# Patient Record
Sex: Female | Born: 1990 | Race: Black or African American | Hispanic: No | Marital: Single | Smoking: Former smoker
Health system: Southern US, Community
[De-identification: ages and names within clinical notes are randomized; demographics above are authoritative.]

## PROBLEM LIST (undated history)

## (undated) HISTORY — PX: CSF SHUNT: SHX92

---
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
## 2010-09-14 ENCOUNTER — Emergency Department: Payer: Self-pay | Admitting: Emergency Medicine

## 2011-09-16 IMAGING — US ABDOMEN ULTRASOUND LIMITED
1 series · 17 of 25 positions shown · non-contrast
Comparison: none

REASON FOR EXAM: ruq/epigastric pain, nausea
COMMENTS:

[Series 1: abdomen ultrasound limited · 17 of 41 slices shown]
[im 1/41]
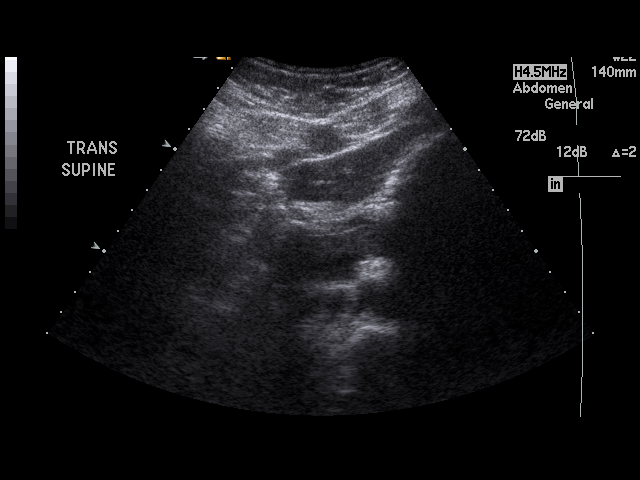
[im 4/41]
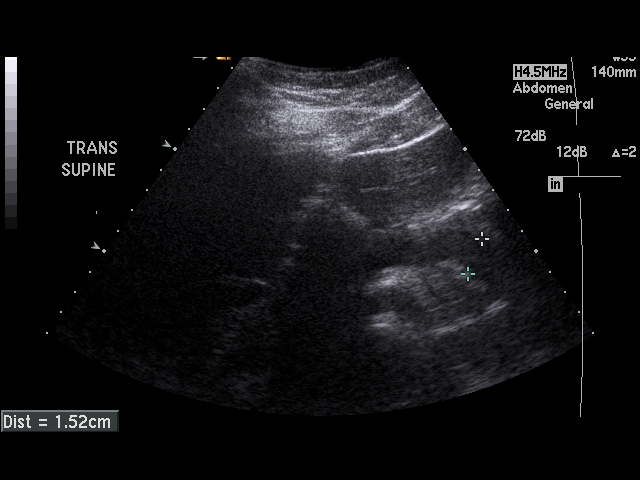
[im 6/41]
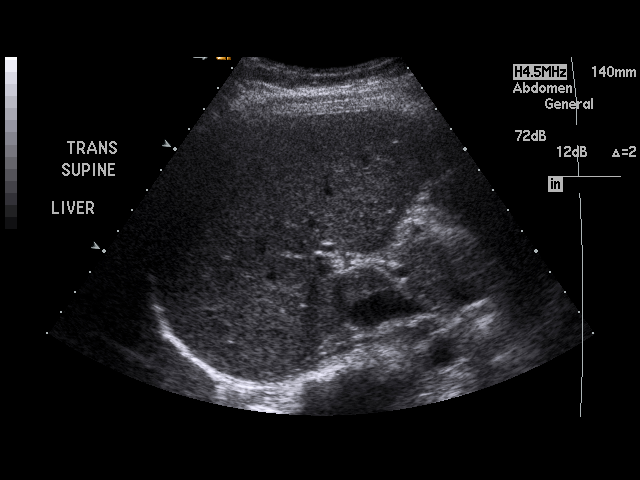
[im 9/41]
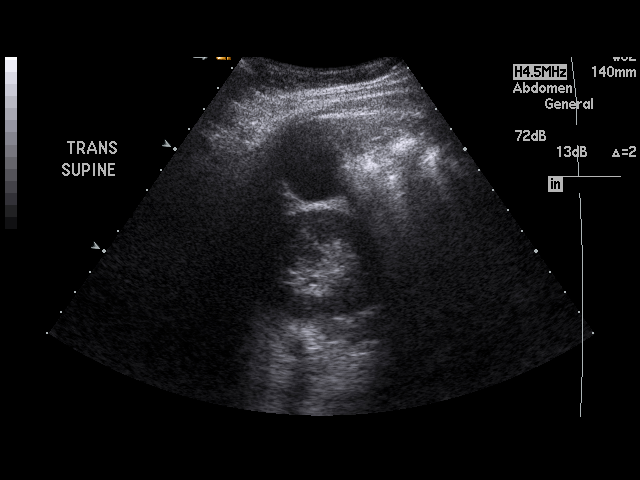
[im 11/41]
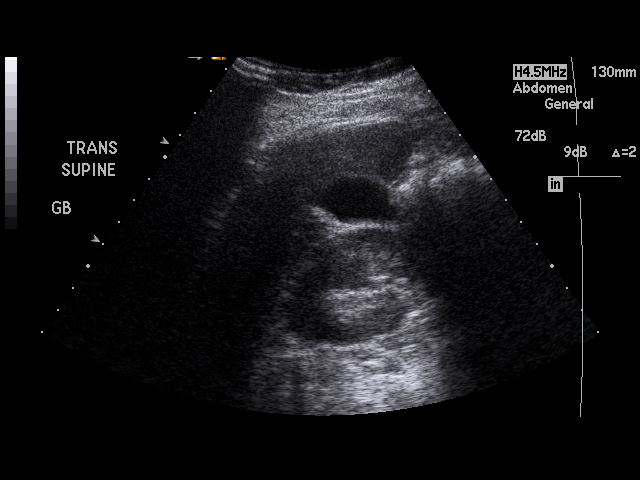
[im 14/41]
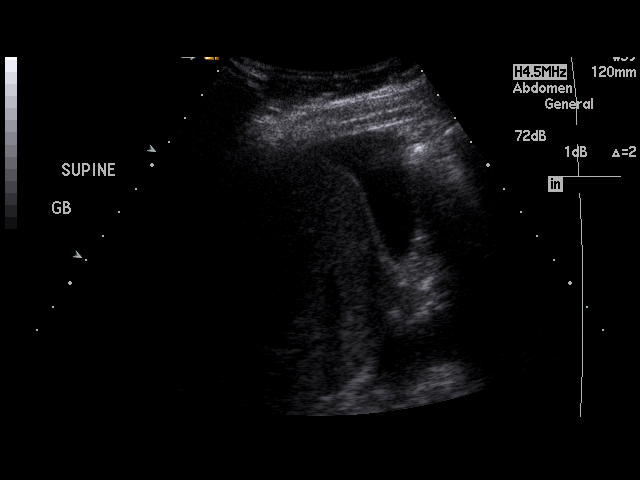
[im 16/41]
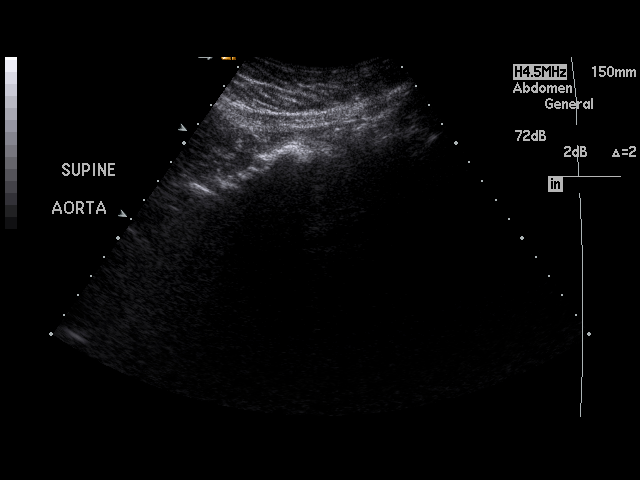
[im 19/41]
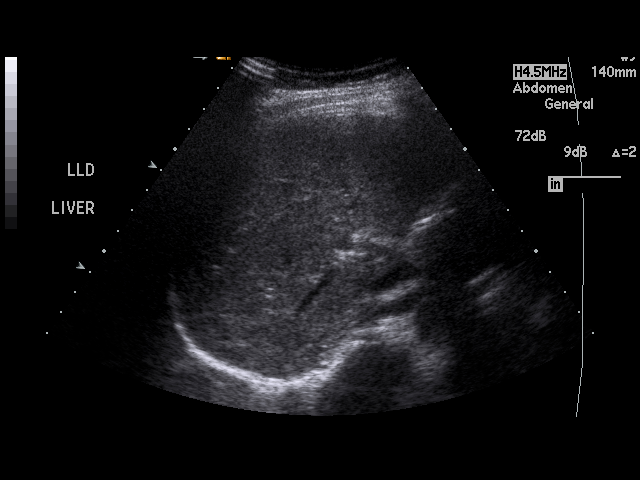
[im 21/41]
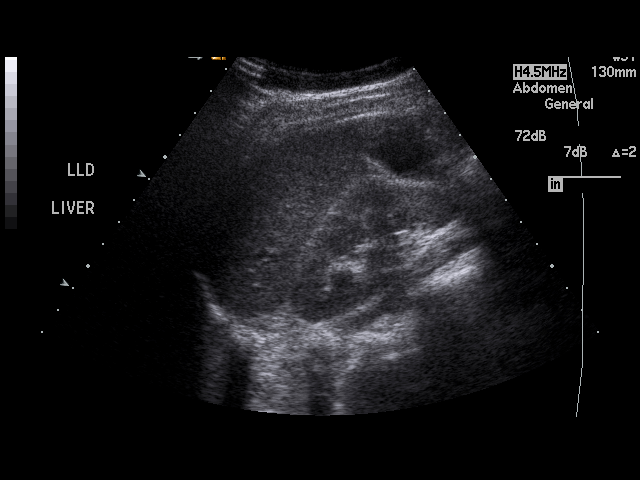
[im 22/41]
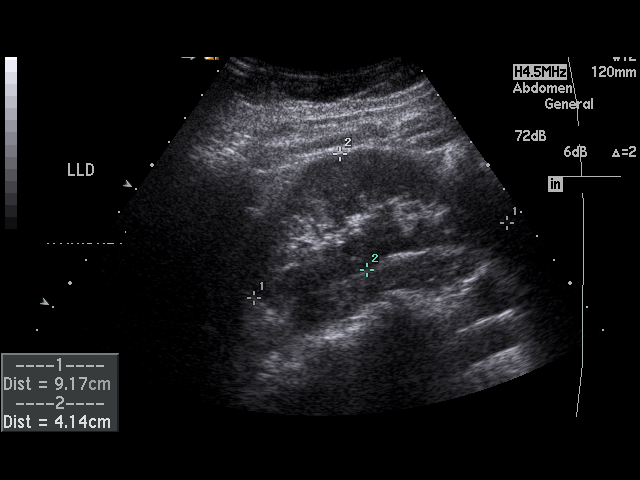
[im 26/41]
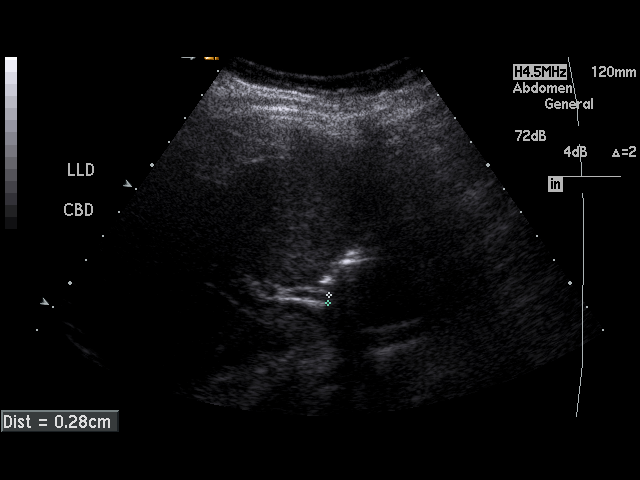
[im 27/41]
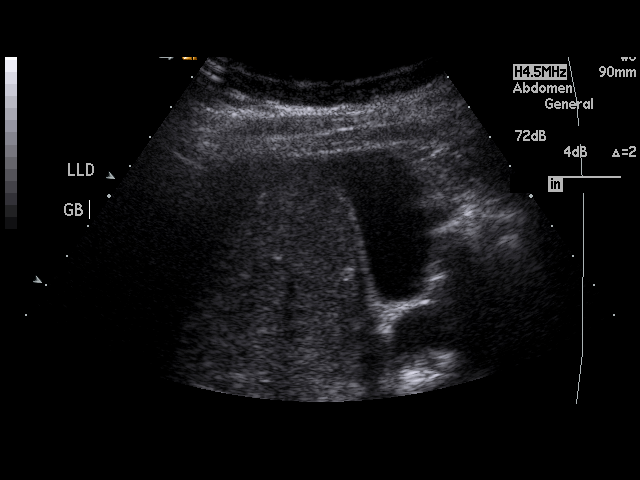
[im 31/41]
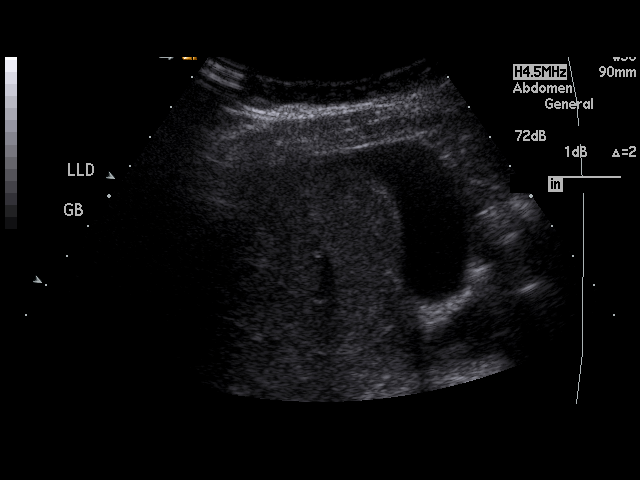
[im 32/41]
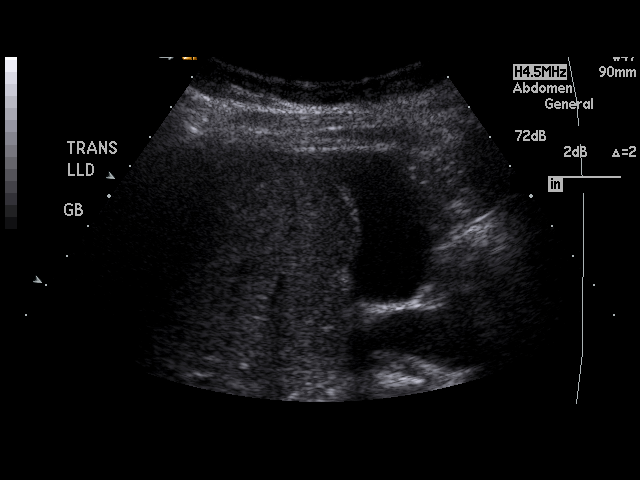
[im 36/41]
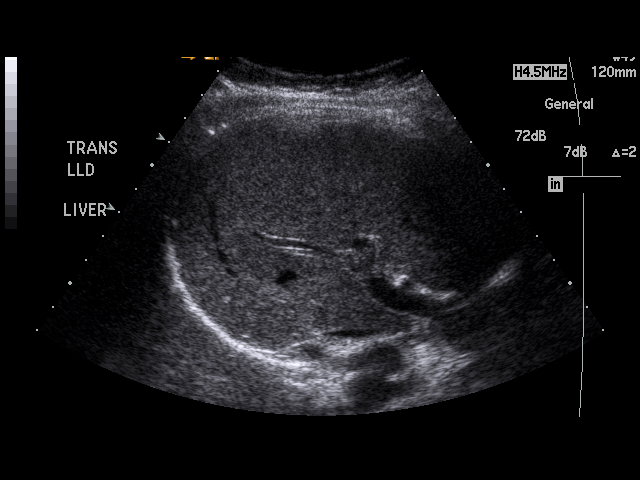
[im 37/41]
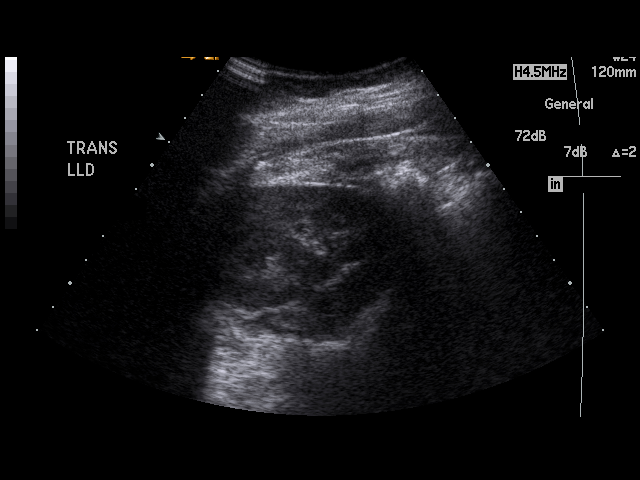
[im 41/41]
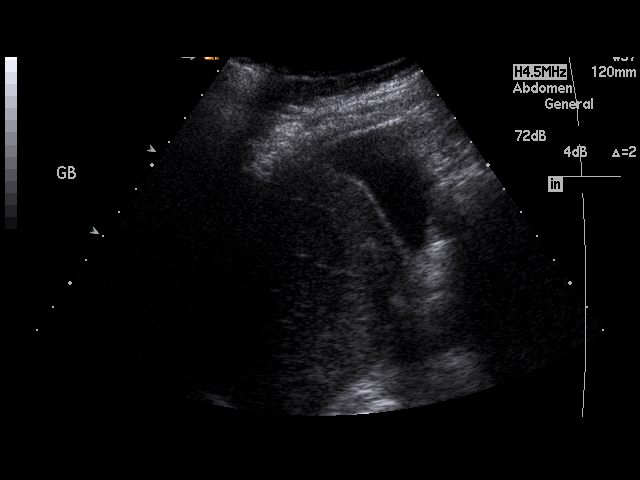

[17 of 25 positions shown; findings below may reference images not displayed]

PROCEDURE:     US  - US ABDOMEN LIMITED SURVEY  - September 15, 2010  [DATE]

RESULT:     The liver, gallbladder, pancreas, and right kidney are normal in
appearance. The common bile duct is normal at just under 3 mm in diameter.
There is no positive sonographic Murphy's sign. There is no evidence of
ascites.
IMPRESSION: Normal right upper quadrant ultrasound.

A preliminary report was sent to the [HOSPITAL] the conclusion
of the study.

## 2011-09-21 ENCOUNTER — Emergency Department: Payer: Self-pay | Admitting: Emergency Medicine

## 2011-12-31 ENCOUNTER — Emergency Department: Payer: Self-pay | Admitting: Emergency Medicine

## 2011-12-31 LAB — URINALYSIS, COMPLETE
Bilirubin,UR: NEGATIVE
Glucose,UR: NEGATIVE mg/dL (ref 0–75)
Ketone: NEGATIVE
Leukocyte Esterase: NEGATIVE
Nitrite: NEGATIVE
Ph: 6 (ref 4.5–8.0)
Protein: NEGATIVE
RBC,UR: 36 /HPF (ref 0–5)
Specific Gravity: 1.029 (ref 1.003–1.030)
Squamous Epithelial: <1
WBC UR: 2 /HPF (ref 0–5)

## 2011-12-31 LAB — WET PREP, GENITAL

## 2011-12-31 LAB — PREGNANCY, URINE: Pregnancy Test, Urine: NEGATIVE m[IU]/mL

## 2015-04-16 ENCOUNTER — Emergency Department (HOSPITAL_COMMUNITY)
Admission: EM | Admit: 2015-04-16 | Discharge: 2015-04-16 | Disposition: A | Discharge status: Home / Self Care | Payer: No Typology Code available for payment source | Attending: Emergency Medicine | Admitting: Emergency Medicine

## 2015-04-16 ENCOUNTER — Encounter (HOSPITAL_COMMUNITY): Payer: Self-pay | Admitting: *Deleted

## 2015-04-16 ENCOUNTER — Emergency Department (HOSPITAL_COMMUNITY): Payer: No Typology Code available for payment source

## 2015-04-16 DIAGNOSIS — R0789 Other chest pain: Secondary | ICD-10-CM

## 2015-04-16 DIAGNOSIS — Z3A24 24 weeks gestation of pregnancy: Secondary | ICD-10-CM | POA: Insufficient documentation

## 2015-04-16 DIAGNOSIS — O9989 Other specified diseases and conditions complicating pregnancy, childbirth and the puerperium: Secondary | ICD-10-CM | POA: Insufficient documentation

## 2015-04-16 LAB — BASIC METABOLIC PANEL
Anion gap: 7 (ref 5–15)
BUN: 13 mg/dL (ref 6–20)
CO2: 22 mmol/L (ref 22–32)
Calcium: 8.5 mg/dL — ABNORMAL LOW (ref 8.9–10.3)
Chloride: 105 mmol/L (ref 101–111)
Creatinine, Ser: 0.64 mg/dL (ref 0.44–1.00)
GFR calc non Af Amer: >60 mL/min (ref >60)
Glucose, Bld: 113 mg/dL — ABNORMAL HIGH (ref 70–99)
Potassium: 3.4 mmol/L — ABNORMAL LOW (ref 3.5–5.1)
SODIUM: 134 mmol/L — AB (ref 135–145)

## 2015-04-16 LAB — CBC
HCT: 31 % — ABNORMAL LOW (ref 36.0–46.0)
Hemoglobin: 10.9 g/dL — ABNORMAL LOW (ref 12.0–15.0)
MCH: 29.3 pg (ref 26.0–34.0)
MCHC: 35.2 g/dL (ref 30.0–36.0)
MCV: 83.3 fL (ref 78.0–100.0)
PLATELETS: 332 10*3/uL (ref 150–400)
RBC: 3.72 MIL/uL — ABNORMAL LOW (ref 3.87–5.11)
RDW: 14.3 % (ref 11.5–15.5)
WBC: 8 10*3/uL (ref 4.0–10.5)

## 2015-04-16 LAB — I-STAT TROPONIN, ED: TROPONIN I, POC: 0 ng/mL (ref 0.00–0.08)

## 2015-04-16 MED ORDER — FAMOTIDINE 20 MG PO TABS
20.0000 mg | ORAL_TABLET | Freq: Once | ORAL | Status: AC
  Administered 2015-04-16: 20 mg via ORAL
  Filled 2015-04-16: qty 1

## 2015-04-16 MED ORDER — FAMOTIDINE 20 MG PO TABS: 20.0000 mg | ORAL_TABLET | Freq: Once | ORAL | Status: DC

## 2015-04-16 NOTE — ED Provider Notes (Signed)
CSN: [id number]     Arrival date & time 04/16/15  0818 History   First MD Initiated Contact with Patient 04/16/15 [phone]     Chief Complaint  Patient presents with  . Chest Pain     (Consider location/radiation/quality/duration/timing/severity/associated sxs/prior Treatment) HPI Kathleen Burnett is a 24 y.o. female G1 P0 with VP shunt, [redacted] weeks pregnant comes in for evaluation of chest pain. Patient states this morning at 7 AM she woke up, getting ready for work when she experienced sharp, central chest pains that radiated to her back. The pain is exacerbated with respiration, nothing makes it better. Rates her discomfort as a 6/10. Denies fevers, chills, nausea or vomiting, shortness of breath, leg swelling, hemoptysis, history of blood clot. No other aggravating or modifying factors.  History reviewed. No pertinent past medical history. Past Surgical History  Procedure Laterality Date  . Csf shunt     History reviewed. No pertinent family history. History  Substance Use Topics  . Smoking status: Not on file  . Smokeless tobacco: Not on file  . Alcohol Use: No   OB History    Gravida Para Term Preterm AB TAB SAB Ectopic Multiple Living   1              Review of Systems A 10 point review of systems was completed and was negative except for pertinent positives and negatives as mentioned in the history of present illness     Allergies  Review of patient's allergies indicates no known allergies.  Home Medications   Prior to Admission medications   Medication Sig Start Date End Date Taking? Authorizing Provider  prenatal vitamin w/FE, FA (PRENATAL 1 + 1) 27-1 MG TABS tablet Take 1 tablet by mouth daily at 12 noon.   Yes Historical Provider, MD  famotidine (PEPCID) 20 MG tablet Take 1 tablet (20 mg total) by mouth once. 04/16/15   Fredia Chittenden, PA-C   BP 110/54 mmHg  Pulse 81  Temp(Src) 98.3 F (36.8 C) (Oral)  Resp 18  Ht 5\' 1"  (1.549 m)  Wt 170 lb (77.111 kg)  BMI  32.14 kg/m2  SpO2 98% Physical Exam  Constitutional: She is oriented to person, place, and time. She appears well-developed and well-nourished.  HENT:  Head: Normocephalic and atraumatic.  Mouth/Throat: Oropharynx is clear and moist.  Eyes: Conjunctivae are normal. Pupils are equal, round, and reactive to light. Right eye exhibits no discharge. Left eye exhibits no discharge. No scleral icterus.  Neck: Neck supple.  Cardiovascular: Normal rate, regular rhythm and normal heart sounds.   Pulmonary/Chest: Effort normal and breath sounds normal. No respiratory distress. She has no wheezes. She has no rales.  Sharp pains reproduced with palpation of lower one third sternum. No obvious lesions, rashes, crepitus or bony step-offs.  Abdominal: Soft. There is no tenderness.  Abdominal distention, consistent with pregnancy. No tenderness  Musculoskeletal: She exhibits no edema or tenderness.  No lower extremity swelling, erythema, warmth or tenderness along the venous system bilaterally.  Neurological: She is alert and oriented to person, place, and time.  Cranial Nerves II-XII grossly intact  Skin: Skin is warm and dry. No rash noted.  Psychiatric: She has a normal mood and affect.  Nursing note and vitals reviewed.   ED Course  Procedures (including critical care time) Labs Review Labs Reviewed  BASIC METABOLIC PANEL - Abnormal; Notable for the following:    Sodium 134 (*)    Potassium 3.4 (*)    Glucose, Bld 113 (*)  Calcium 8.5 (*)    All other components within normal limits  CBC - Abnormal; Notable for the following:    RBC 3.72 (*)    Hemoglobin 10.9 (*)    HCT 31.0 (*)    All other components within normal limits  Rosezena SensorI-STAT TROPOININ, ED    Imaging Review Dg Chest 2 View  04/16/2015   CLINICAL DATA:  24 year old female with a history of substernal chest pain.  EXAM: CHEST - 2 VIEW  COMPARISON:  None.  FINDINGS: Cardiomediastinal silhouette projects within normal limits in size  and contour. No confluent airspace disease, pneumothorax, or pleural effusion.  No displaced fracture.  Unremarkable appearance of the upper abdomen.  Shunt tubing projects over the right lower neck and right chest.  IMPRESSION: No radiographic evidence of acute cardiopulmonary disease.  Signed,  Yvone NeuJaime S. Loreta AveWagner, DO  [facility]  [facility]   Electronically Signed   By: Gilmer MorJaime  Wagner D.O.   On: 04/16/2015 11:27     EKG Interpretation   Date/Time:  Saturday Apr 16 2015 08:25:12 EDT Ventricular Rate:  91 PR Interval:  115 QRS Duration: 80 QT Interval:  375 QTC Calculation: 461 R Axis:   73 Text Interpretation:  Sinus rhythm Borderline short PR interval Confirmed  by Rubin PayorPICKERING  MD, NATHAN [phone]([id number]) on 04/16/2015 8:44:42 AM     Meds given in ED:  Medications  famotidine (PEPCID) tablet 20 mg (20 mg Oral Given 04/16/15 1235)    New Prescriptions   FAMOTIDINE (PEPCID) 20 MG TABLET    Take 1 tablet (20 mg total) by mouth once.   Filed Vitals:   04/16/15 0830 04/16/15 0844 04/16/15 1133  BP: 106/56  110/54  Pulse: 94  81  Temp: 98.3 F (36.8 C)    TempSrc: Oral    Resp: 11  18  Height:  5\' 1"  (1.549 m)   Weight:  170 lb (77.111 kg)   SpO2: 99%  98%    MDM  Vitals stable - WNL -afebrile Pt resting comfortably in ED. PE--discomfort replicated on physical exam with palpation to distal sternum. No evidence of DVT. Labwork-labs noncontributory. Imaging-chest x-ray shows no acute cardio pulmonary pathology.  DDX--given patient stable vitals, no evidence of DVT, normal chest x-ray, there is a low concern for PE at this point. Patient's symptoms could be attributable to GERD and/or musculoskeletal in nature. No evidence of other acute or emergent pathology at this time. Will DC with PPI and instructions to follow-up with primary care for further evaluation and management of symptoms.  I discussed all relevant lab findings and imaging results  with pt and they verbalized understanding. Discussed f/u with PCP within 48 hrs and return precautions, pt very amenable to plan.  Final diagnoses:  Chest discomfort        Joycie PeekBenjamin Ramsey Midgett, PA-C 04/16/15 1259  Benjiman CoreNathan Pickering, MD 04/17/15 1435

## 2015-04-16 NOTE — Discharge Instructions (Signed)
You were evaluated in the ED today for your chest pain. There does not appear to be an emergent cause for your symptoms at this time. Please take your medications as prescribed for esophageal reflux as this may be contributing to your chest discomfort. Please follow-up with primary care for further evaluation and management of your symptoms. Return to ED for new or worsening symptoms.  Chest Pain (Nonspecific) It is often hard to give a specific diagnosis for the cause of chest pain. There is always a chance that your pain could be related to something serious, such as a heart attack or a blood clot in the lungs. You need to follow up with your health care provider for further evaluation. CAUSES   Heartburn.  Pneumonia or bronchitis.  Anxiety or stress.  Inflammation around your heart (pericarditis) or lung (pleuritis or pleurisy).  A blood clot in the lung.  A collapsed lung (pneumothorax). It can develop suddenly on its own (spontaneous pneumothorax) or from trauma to the chest.  Shingles infection (herpes zoster virus). The chest wall is composed of bones, muscles, and cartilage. Any of these can be the source of the pain.  The bones can be bruised by injury.  The muscles or cartilage can be strained by coughing or overwork.  The cartilage can be affected by inflammation and become sore (costochondritis). DIAGNOSIS  Lab tests or other studies may be needed to find the cause of your pain. Your health care provider may have you take a test called an ambulatory electrocardiogram (ECG). An ECG records your heartbeat patterns over a 24-hour period. You may also have other tests, such as:  Transthoracic echocardiogram (TTE). During echocardiography, sound waves are used to evaluate how blood flows through your heart.  Transesophageal echocardiogram (TEE).  Cardiac monitoring. This allows your health care provider to monitor your heart rate and rhythm in real time.  Holter monitor.  This is a portable device that records your heartbeat and can help diagnose heart arrhythmias. It allows your health care provider to track your heart activity for several days, if needed.  Stress tests by exercise or by giving medicine that makes the heart beat faster. TREATMENT   Treatment depends on what may be causing your chest pain. Treatment may include:  Acid blockers for heartburn.  Anti-inflammatory medicine.  Pain medicine for inflammatory conditions.  Antibiotics if an infection is present.  You may be advised to change lifestyle habits. This includes stopping smoking and avoiding alcohol, caffeine, and chocolate.  You may be advised to keep your head raised (elevated) when sleeping. This reduces the chance of acid going backward from your stomach into your esophagus. Most of the time, nonspecific chest pain will improve within 2-3 days with rest and mild pain medicine.  HOME CARE INSTRUCTIONS   If antibiotics were prescribed, take them as directed. Finish them even if you start to feel better.  For the next few days, avoid physical activities that bring on chest pain. Continue physical activities as directed.  Do not use any tobacco products, including cigarettes, chewing tobacco, or electronic cigarettes.  Avoid drinking alcohol.  Only take medicine as directed by your health care provider.  Follow your health care provider's suggestions for further testing if your chest pain does not go away.  Keep any follow-up appointments you made. If you do not go to an appointment, you could develop lasting (chronic) problems with pain. If there is any problem keeping an appointment, call to reschedule. SEEK MEDICAL CARE IF:  Your chest pain does not go away, even after treatment.  You have a rash with blisters on your chest.  You have a fever. SEEK IMMEDIATE MEDICAL CARE IF:   You have increased chest pain or pain that spreads to your arm, neck, jaw, back, or  abdomen.  You have shortness of breath.  You have an increasing cough, or you cough up blood.  You have severe back or abdominal pain.  You feel nauseous or vomit.  You have severe weakness.  You faint.  You have chills. This is an emergency. Do not wait to see if the pain will go away. Get medical help at once. Call your local emergency services (911 in [place] Do not drive yourself to the hospital. MAKE SURE YOU:   Understand these instructions.  Will watch your condition.  Will get help right away if you are not doing well or get worse. Document Released: 09/05/2005 Document Revised: 12/01/2013 Document Reviewed: 07/01/2008 [facility] Patient Information 2015 [employer] [place]. This information is not intended to replace advice given to you by your health care provider. Make sure you discuss any questions you have with your health care provider.

## 2015-04-16 NOTE — ED Notes (Signed)
Pt reports being approx [redacted] weeks pregnant, having mid sharp chest pains that started this am, occ radiates into her back and having sob.

## 2015-05-18 ENCOUNTER — Inpatient Hospital Stay (HOSPITAL_COMMUNITY)
Admission: EM | Admit: 2015-05-18 | Discharge: 2015-05-19 | Disposition: A | Discharge status: Other Acute Inpatient Hospital | Payer: No Typology Code available for payment source | Attending: Obstetrics & Gynecology | Admitting: Obstetrics & Gynecology

## 2015-05-18 ENCOUNTER — Encounter (HOSPITAL_COMMUNITY): Payer: Self-pay | Admitting: *Deleted

## 2015-05-18 DIAGNOSIS — Z87891 Personal history of nicotine dependence: Secondary | ICD-10-CM | POA: Insufficient documentation

## 2015-05-18 DIAGNOSIS — R1084 Generalized abdominal pain: Secondary | ICD-10-CM | POA: Diagnosis not present

## 2015-05-18 DIAGNOSIS — O4703 False labor before 37 completed weeks of gestation, third trimester: Secondary | ICD-10-CM

## 2015-05-18 DIAGNOSIS — Z3A29 29 weeks gestation of pregnancy: Secondary | ICD-10-CM | POA: Insufficient documentation

## 2015-05-18 DIAGNOSIS — Z982 Presence of cerebrospinal fluid drainage device: Secondary | ICD-10-CM | POA: Diagnosis not present

## 2015-05-18 DIAGNOSIS — O9989 Other specified diseases and conditions complicating pregnancy, childbirth and the puerperium: Secondary | ICD-10-CM | POA: Diagnosis present

## 2015-05-18 LAB — URINALYSIS, ROUTINE W REFLEX MICROSCOPIC
Bilirubin Urine: NEGATIVE
Glucose, UA: NEGATIVE mg/dL
Hgb urine dipstick: NEGATIVE
KETONES UR: NEGATIVE mg/dL
LEUKOCYTES UA: NEGATIVE
Nitrite: NEGATIVE
PH: 6 (ref 5.0–8.0)
Protein, ur: NEGATIVE mg/dL
Specific Gravity, Urine: 1.02 (ref 1.005–1.030)
Urobilinogen, UA: 0.2 mg/dL (ref 0.0–1.0)

## 2015-05-18 LAB — COMPREHENSIVE METABOLIC PANEL
ALT: 12 U/L — AB (ref 14–54)
AST: 27 U/L (ref 15–41)
Albumin: 2.9 g/dL — ABNORMAL LOW (ref 3.5–5.0)
Alkaline Phosphatase: 53 U/L (ref 38–126)
Anion gap: 9 (ref 5–15)
BILIRUBIN TOTAL: 0.8 mg/dL (ref 0.3–1.2)
BUN: 9 mg/dL (ref 6–20)
CO2: 19 mmol/L — ABNORMAL LOW (ref 22–32)
CREATININE: 0.72 mg/dL (ref 0.44–1.00)
Calcium: 8.6 mg/dL — ABNORMAL LOW (ref 8.9–10.3)
Chloride: 107 mmol/L (ref 101–111)
GFR calc Af Amer: >60 mL/min (ref >60)
GFR calc non Af Amer: >60 mL/min (ref >60)
Glucose, Bld: 102 mg/dL — ABNORMAL HIGH (ref 65–99)
POTASSIUM: 3.9 mmol/L (ref 3.5–5.1)
SODIUM: 135 mmol/L (ref 135–145)
Total Protein: 6.6 g/dL (ref 6.5–8.1)

## 2015-05-18 LAB — CBC WITH DIFFERENTIAL/PLATELET
BASOS ABS: 0 10*3/uL (ref 0.0–0.1)
Basophils Relative: 0 % (ref 0–1)
EOS PCT: 1 % (ref 0–5)
Eosinophils Absolute: 0.1 10*3/uL (ref 0.0–0.7)
HCT: 31.2 % — ABNORMAL LOW (ref 36.0–46.0)
Hemoglobin: 11.2 g/dL — ABNORMAL LOW (ref 12.0–15.0)
LYMPHS PCT: 20 % (ref 12–46)
Lymphs Abs: 1.9 10*3/uL (ref 0.7–4.0)
MCH: 29.4 pg (ref 26.0–34.0)
MCHC: 35.9 g/dL (ref 30.0–36.0)
MCV: 81.9 fL (ref 78.0–100.0)
Monocytes Absolute: 0.6 10*3/uL (ref 0.1–1.0)
Monocytes Relative: 7 % (ref 3–12)
Neutro Abs: 6.5 10*3/uL (ref 1.7–7.7)
Neutrophils Relative %: 72 % (ref 43–77)
Platelets: 338 10*3/uL (ref 150–400)
RBC: 3.81 MIL/uL — AB (ref 3.87–5.11)
RDW: 14.5 % (ref 11.5–15.5)
WBC: 9.1 10*3/uL (ref 4.0–10.5)

## 2015-05-18 LAB — LIPASE, BLOOD: Lipase: 19 U/L — ABNORMAL LOW (ref 22–51)

## 2015-05-18 MED ORDER — SODIUM CHLORIDE 0.9 % IV SOLN
1000.0000 mL | Freq: Once | INTRAVENOUS | Status: AC
  Administered 2015-05-18: 1000 mL via INTRAVENOUS

## 2015-05-18 MED ORDER — SODIUM CHLORIDE 0.9 % IV SOLN
1000.0000 mL | INTRAVENOUS | Status: DC
  Administered 2015-05-18: 1000 mL via INTRAVENOUS

## 2015-05-18 NOTE — ED Notes (Signed)
[facility] RN to bedside

## 2015-05-18 NOTE — ED Notes (Signed)
The pt is 29 weeks preg she receives her care in [place] and she was brought here by her boyfriend who lives in [place].  She has had abd pain this afternoon.  No vaginal bleeding no discharge.  She has had a normal pregnancy this is her first baby.  edc aug 24th

## 2015-05-18 NOTE — Progress Notes (Signed)
Pt is a G1P0 @29wks , c/o abd pain. She receives prenatal care in [place]. FHT 140/+accels/no decels, UC 2-4/60sec/mild, SVE closed/20/-3. Notified Dr. Despina HiddenEure. Patient to transfer to [facility] via [facility].

## 2015-05-18 NOTE — MAU Note (Signed)
[facility] by [facility] for complaints of abdominal pain and cramping, Receives care in [place]

## 2015-05-18 NOTE — ED Notes (Signed)
[facility] paged.  

## 2015-05-18 NOTE — ED Provider Notes (Signed)
CSN: [id number]     Arrival date & time 05/18/15  1855 History   First MD Initiated Contact with Patient 05/18/15 2040     Chief Complaint  Patient presents with  . Abdominal Pain  . 29 weeks preg    HPI Patient is 21 weeks estimated gestational age. He is G1 P0. Her EDC is August 24.  Patient began having generalized abdominal pain this afternoon. Pain is constant and not intermittent. She denies any trouble with vaginal bleeding or discharge. She has not felt any contractions. She denies any trouble with nausea, vomiting, diarrhea patient has been getting prenatal care and asked for help. She is here in the emergency room with her boyfriend  who [place] in [place] so [facility]. History reviewed. No pertinent past medical history. Past Surgical History  Procedure Laterality Date  . Csf shunt     No family history on file. History  Substance Use Topics  . Smoking status: Former Games developermoker  . Smokeless tobacco: Not on file  . Alcohol Use: No   OB History    Gravida Para Term Preterm AB TAB SAB Ectopic Multiple Living   1              Review of Systems  All other systems reviewed and are negative.     Allergies  Review of patient's allergies indicates no known allergies.  Home Medications   Prior to Admission medications   Medication Sig Start Date End Date Taking? Authorizing Provider  prenatal vitamin w/FE, FA (PRENATAL 1 + 1) 27-1 MG TABS tablet Take 1 tablet by mouth daily at 12 noon.   Yes Historical Provider, MD  famotidine (PEPCID) 20 MG tablet Take 1 tablet (20 mg total) by mouth once. Patient not taking: Reported on 05/18/2015 04/16/15   Joycie PeekBenjamin Cartner, PA-C   BP 115/65 mmHg  Pulse 91  Temp(Src) 99.1 F (37.3 C) (Oral)  Resp 20  SpO2 100% Physical Exam  Constitutional: She appears well-developed and well-nourished. No distress.  HENT:  Head: Normocephalic and atraumatic.  Right Ear: External ear normal.  Left Ear: External ear normal.   Eyes: Conjunctivae are normal. Right eye exhibits no discharge. Left eye exhibits no discharge. No scleral icterus.  Neck: Neck supple. No tracheal deviation present.  Cardiovascular: Normal rate, regular rhythm and intact distal pulses.   Pulmonary/Chest: Effort normal and breath sounds normal. No stridor. No respiratory distress. She has no wheezes. She has no rales.  Abdominal: Soft. Bowel sounds are normal. She exhibits no distension. There is tenderness. There is no rebound and no guarding.  Gravid uterus, generalized tenderness to palpation  Musculoskeletal: She exhibits no edema or tenderness.  Neurological: She is alert. She has normal strength. No cranial nerve deficit (no facial droop, extraocular movements intact, no slurred speech) or sensory deficit. She exhibits normal muscle tone. She displays no seizure activity. Coordination normal.  Skin: Skin is warm and dry. No rash noted.  Psychiatric: She has a normal mood and affect.  Nursing note and vitals reviewed.   ED Course  Procedures (including critical care time) Labs Review Labs Reviewed  CBC WITH DIFFERENTIAL/PLATELET - Abnormal; Notable for the following:    RBC 3.81 (*)    Hemoglobin 11.2 (*)    HCT 31.2 (*)    All other components within normal limits  COMPREHENSIVE METABOLIC PANEL - Abnormal; Notable for the following:    CO2 19 (*)    Glucose, Bld 102 (*)  Calcium 8.6 (*)    Albumin 2.9 (*)    ALT 12 (*)    All other components within normal limits  LIPASE, BLOOD - Abnormal; Notable for the following:    Lipase 19 (*)    All other components within normal limits  URINALYSIS, ROUTINE W REFLEX MICROSCOPIC (NOT AT [facility]) - Abnormal; Notable for the following:    APPearance CLOUDY (*)    All other components within normal limits    Medications  0.9 %  sodium chloride infusion (1,000 mLs Intravenous New Bag/Given 05/18/15 2131)    Followed by  0.9 %  sodium chloride infusion (not administered)     MDM    Final diagnoses:  Premature uterine contractions causing threatened premature labor, third trimester    Fetal monitoring shows contractions.  Sx concerning for PTL.  Pt given IV fluids.  [facility] nurse assessed cervix which is closed.  Dr Despina Hidden was contacted by the rapid ob nurse.  Plan is for trasnfer to MAU for further evaluation.    Linwood Dibbles, MD 05/18/15 2216

## 2015-05-18 NOTE — ED Notes (Signed)
[facility] at bedside 

## 2015-05-19 DIAGNOSIS — O4703 False labor before 37 completed weeks of gestation, third trimester: Secondary | ICD-10-CM | POA: Diagnosis not present

## 2015-05-19 LAB — WET PREP, GENITAL
Clue Cells Wet Prep HPF POC: NONE SEEN
Trich, Wet Prep: NONE SEEN
YEAST WET PREP: NONE SEEN

## 2015-05-19 LAB — GC/CHLAMYDIA PROBE AMP (~~LOC~~) NOT AT ARMC
CHLAMYDIA, DNA PROBE: NEGATIVE
Neisseria Gonorrhea: NEGATIVE

## 2015-05-19 NOTE — MAU Provider Note (Signed)
History   CSN: [id number]  Arrival date and time: 05/18/15 5300  Chief Complaint  Patient presents with  . Abdominal Pain  . 29 weeks preg    HPI  Patient is 24 y.o. G1P0 [redacted]w[redacted]d here with complaints of abdominal pain since yesterday. She states that the pain comes and goes. Today the pain started be more consistent so she decided to go to the hospital. +FM, denies LOF, VB, vaginal discharge. Unsure if what she is feeling are contractions.   Of note, patient [facility] ED. She was triaged by [facility] nurse provider. FHT 140/+accels/no decels, UC 2-4/60sec/mild, SVE closed/20/-3. She was then transferred to MAU.    OB History    Gravida Para Term Preterm AB TAB SAB Ectopic Multiple Living   1             -Receives prenatal care in [place]  History reviewed. No pertinent past medical history.  Past Surgical History  Procedure Laterality Date  . Csf shunt      History reviewed. No pertinent family history.  History  Substance Use Topics  . Smoking status: Former Games developer  . Smokeless tobacco: Not on file  . Alcohol Use: No    Allergies: No Known Allergies  Prescriptions prior to admission  Medication Sig Dispense Refill Last Dose  . prenatal vitamin w/FE, FA (PRENATAL 1 + 1) 27-1 MG TABS tablet Take 1 tablet by mouth daily at 12 noon.   05/17/2015 at Unknown time  . famotidine (PEPCID) 20 MG tablet Take 1 tablet (20 mg total) by mouth once. (Patient not taking: Reported on 05/18/2015) 20 tablet 0 Not Taking at Unknown time   Review of Systems  Constitutional: Negative for chills.  Eyes: Negative for blurred vision and double vision.  Respiratory: Negative for shortness of breath.   Cardiovascular: Negative for chest pain and leg swelling.  Gastrointestinal: Negative for nausea, vomiting and diarrhea.   Physical Exam   Blood pressure 118/65, pulse 84, temperature 98.1 F (36.7 C), temperature source Oral, resp. rate 20, SpO2 100 %.  Physical Exam   Constitutional: She is oriented to person, place, and time. She appears well-developed and well-nourished. No distress.  HENT:  Head: Normocephalic and atraumatic.  Eyes: EOM are normal.  Cardiovascular: Normal rate, regular rhythm and intact distal pulses.   Respiratory: Effort normal and breath sounds normal.  GI: Soft. There is no tenderness.  Musculoskeletal: Normal range of motion. She exhibits no edema.  Neurological: She is alert and oriented to person, place, and time.  Skin: Skin is warm and dry.    MAU Course  Procedures - None  MDM: -NST reviewed and reassuring and reactive -toco originally with ctx q2-78min >> resolved s/p IVF -SVE with closed thick posterior cervix -wet prep unremarkable -GC/Ch pending  Assessment and Plan  A: Patient is 24 y.o. G1P0 [redacted]w[redacted]d reporting intermittent abdominal pain likely secondary to premature uterine contractions that are not causing cervical change. Most likely caused by uterine irritability and dehydration. Patient is in stable condition with resolution of contractions at time of discharge.   P: - counseled patient on staying hydrated and pelvic rest - will contact patient if GC/Ch screen positive - fetal kick counts reinforced - preterm labor precautions - handout given  Caryl Ada, DO 05/19/2015, 12:57 AM PGY-1, [facility]  I spoke with and examined patient and agree with resident/PA/SNM's note and plan of care.  Cheral Marker, CNM, [facility] 05/23/2015 8:29 AM

## 2015-05-19 NOTE — Discharge Instructions (Signed)
Please follow-up at scheduled appointment today.  We recommend you continue to stay hydrated and have pelvic rest. You will be notified if labs come back positive.

## 2016-03-22 ENCOUNTER — Encounter (HOSPITAL_COMMUNITY): Payer: Self-pay | Admitting: *Deleted

## 2016-04-16 IMAGING — CR DG CHEST 2V
2 series · 2 of 2 positions shown · non-contrast
Comparison: None.

CLINICAL DATA: 24-year-old female with a history of substernal
chest pain.

EXAM:
CHEST - 2 VIEW

[chest pa]
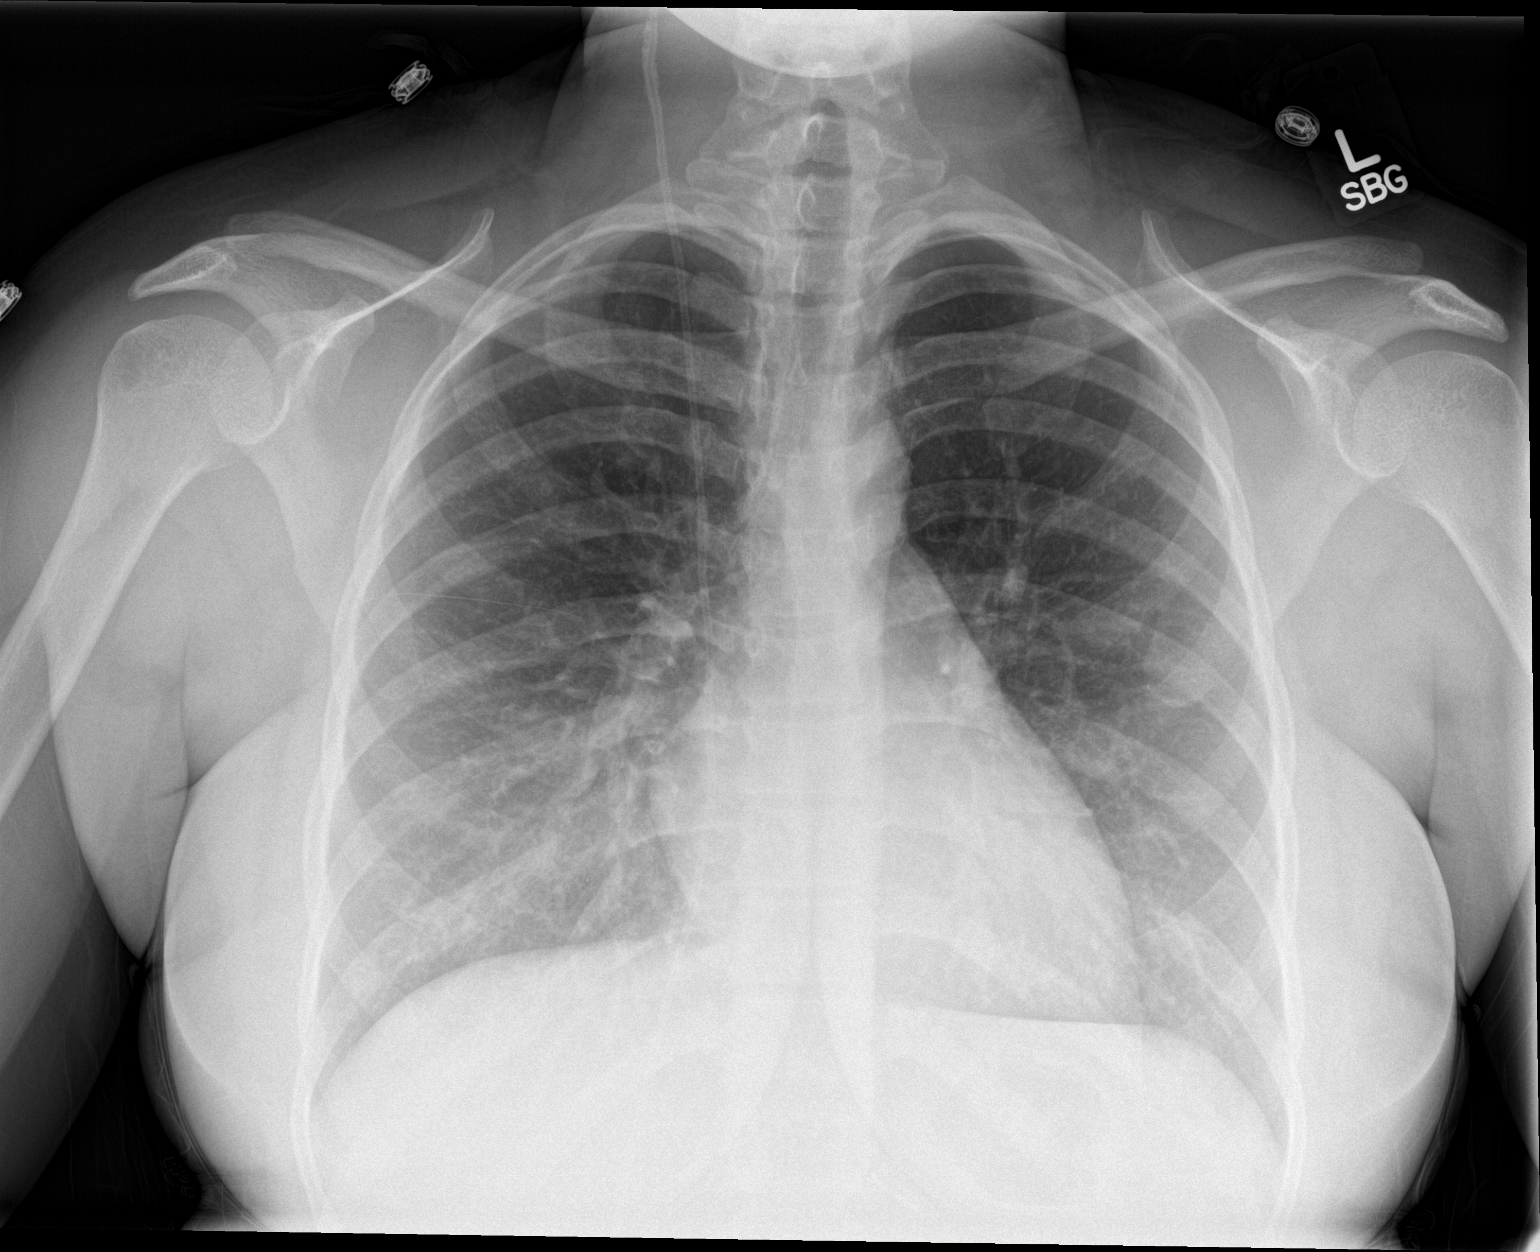

[chest lat]
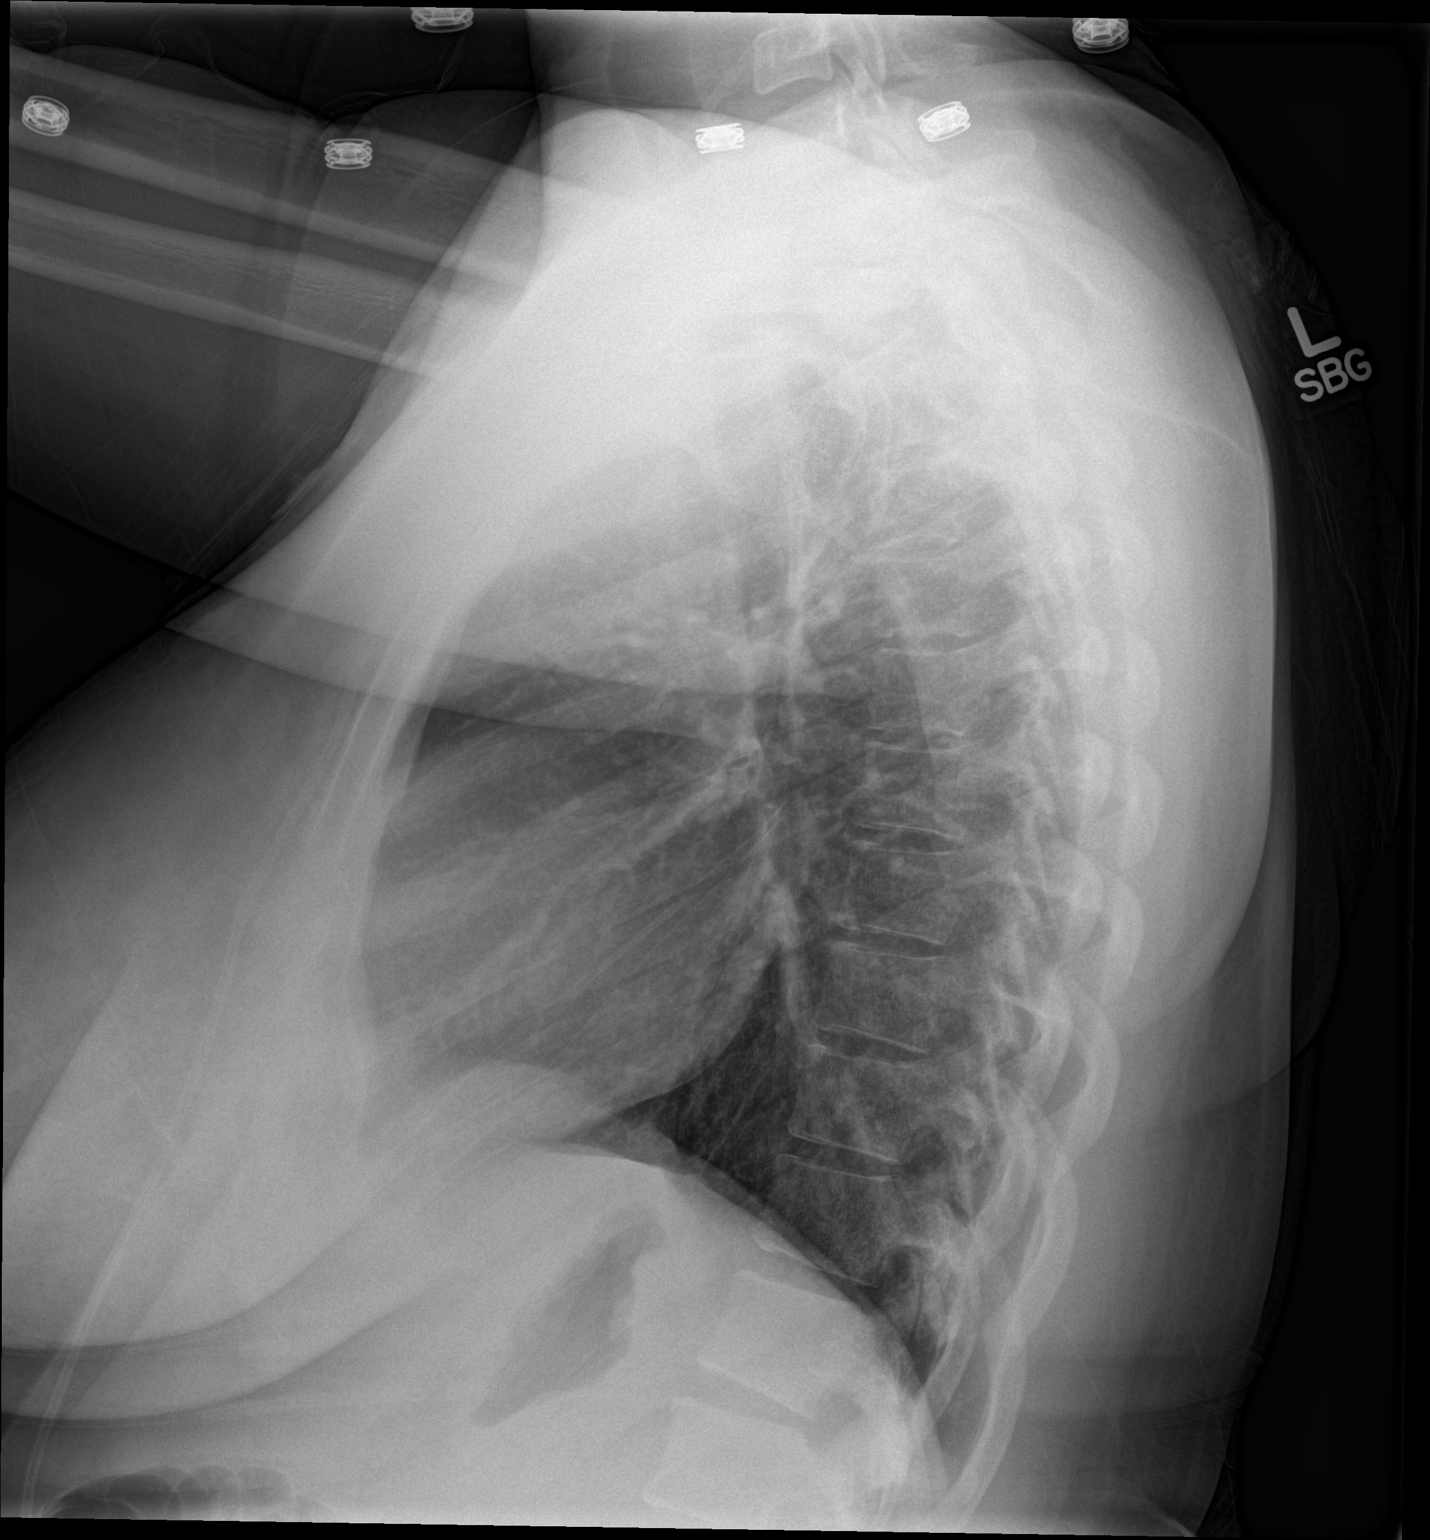

[2 of 2 positions shown; findings below may reference images not displayed]

FINDINGS: Cardiomediastinal silhouette projects within normal limits in size
and contour. No confluent airspace disease, pneumothorax, or pleural
effusion.

No displaced fracture.

Unremarkable appearance of the upper abdomen.

Shunt tubing projects over the right lower neck and right chest.
IMPRESSION: No radiographic evidence of acute cardiopulmonary disease.
